# Patient Record
Sex: Male | Born: 1965 | Race: Black or African American | Hispanic: No | Marital: Married | Smoking: Never smoker
Health system: Southern US, Community
[De-identification: ages and names within clinical notes are randomized; demographics above are authoritative.]

## PROBLEM LIST (undated history)

## (undated) DIAGNOSIS — E538 Deficiency of other specified B group vitamins: Secondary | ICD-10-CM

## (undated) DIAGNOSIS — D329 Benign neoplasm of meninges, unspecified: Secondary | ICD-10-CM

## (undated) DIAGNOSIS — R7309 Other abnormal glucose: Secondary | ICD-10-CM

## (undated) DIAGNOSIS — Z8619 Personal history of other infectious and parasitic diseases: Secondary | ICD-10-CM

## (undated) DIAGNOSIS — S86012A Strain of left Achilles tendon, initial encounter: Secondary | ICD-10-CM

## (undated) DIAGNOSIS — E559 Vitamin D deficiency, unspecified: Secondary | ICD-10-CM

## (undated) HISTORY — PX: COLONOSCOPY: SHX174

## (undated) HISTORY — PX: COLONOSCOPY W/ POLYPECTOMY: SHX1380

---

## 2013-09-10 ENCOUNTER — Ambulatory Visit: Payer: Self-pay | Admitting: Gastroenterology

## 2013-11-13 ENCOUNTER — Emergency Department: Payer: Self-pay | Admitting: Emergency Medicine

## 2013-11-13 LAB — CBC
HCT: 45.9 % (ref 40.0–52.0)
HGB: 15.1 g/dL (ref 13.0–18.0)
MCH: 29.9 pg (ref 26.0–34.0)
MCHC: 32.9 g/dL (ref 32.0–36.0)
MCV: 91 fL (ref 80–100)
Platelet: 177 10*3/uL (ref 150–440)
RBC: 5.04 10*6/uL (ref 4.40–5.90)
RDW: 13.7 % (ref 11.5–14.5)
WBC: 4.3 10*3/uL (ref 3.8–10.6)

## 2013-11-13 LAB — BASIC METABOLIC PANEL
ANION GAP: 4 — AB (ref 7–16)
BUN: 12 mg/dL (ref 7–18)
CALCIUM: 8.7 mg/dL (ref 8.5–10.1)
CHLORIDE: 106 mmol/L (ref 98–107)
CREATININE: 1.14 mg/dL (ref 0.60–1.30)
Co2: 31 mmol/L (ref 21–32)
EGFR (African American): >60
EGFR (Non-African Amer.): >60
Glucose: 93 mg/dL (ref 65–99)
Osmolality: 281 (ref 275–301)
POTASSIUM: 4 mmol/L (ref 3.5–5.1)
SODIUM: 141 mmol/L (ref 136–145)

## 2013-11-13 LAB — TROPONIN I: Troponin-I: <0.02 ng/mL

## 2013-11-14 LAB — TROPONIN I: Troponin-I: <0.02 ng/mL

## 2013-11-14 LAB — CK TOTAL AND CKMB (NOT AT ARMC)
CK, Total: 259 U/L
CK-MB: 1.2 ng/mL (ref 0.5–3.6)

## 2021-05-20 ENCOUNTER — Encounter: Payer: Self-pay | Admitting: Emergency Medicine

## 2021-05-20 ENCOUNTER — Emergency Department: Payer: BLUE CROSS/BLUE SHIELD

## 2021-05-20 ENCOUNTER — Other Ambulatory Visit: Payer: Self-pay

## 2021-05-20 ENCOUNTER — Emergency Department
Admission: EM | Admit: 2021-05-20 | Discharge: 2021-05-20 | Disposition: A | Discharge status: Home / Self Care | Payer: BLUE CROSS/BLUE SHIELD | Attending: Emergency Medicine | Admitting: Emergency Medicine

## 2021-05-20 DIAGNOSIS — S161XXA Strain of muscle, fascia and tendon at neck level, initial encounter: Secondary | ICD-10-CM | POA: Insufficient documentation

## 2021-05-20 DIAGNOSIS — M7918 Myalgia, other site: Secondary | ICD-10-CM

## 2021-05-20 DIAGNOSIS — S0990XA Unspecified injury of head, initial encounter: Secondary | ICD-10-CM | POA: Diagnosis not present

## 2021-05-20 DIAGNOSIS — Y92410 Unspecified street and highway as the place of occurrence of the external cause: Secondary | ICD-10-CM | POA: Insufficient documentation

## 2021-05-20 DIAGNOSIS — M25511 Pain in right shoulder: Secondary | ICD-10-CM | POA: Insufficient documentation

## 2021-05-20 DIAGNOSIS — M545 Low back pain, unspecified: Secondary | ICD-10-CM | POA: Insufficient documentation

## 2021-05-20 DIAGNOSIS — S199XXA Unspecified injury of neck, initial encounter: Secondary | ICD-10-CM | POA: Diagnosis present

## 2021-05-20 MED ORDER — CYCLOBENZAPRINE HCL 5 MG PO TABS: 5.0000 mg | ORAL_TABLET | Freq: Three times a day (TID) | ORAL | 0 refills | Status: AC | PRN

## 2021-05-20 NOTE — Discharge Instructions (Addendum)
Your exam and CT scans are normal and reassuring. You should take OTC Tylenol and Motrin as needed. Take the muscle relaxant as needed. Apply ice and/or moist heat to any sore muscles. Follow-up with your PCP for further evaluation of your incidental meningioma finding.
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
## 2021-06-19 ENCOUNTER — Encounter (HOSPITAL_COMMUNITY): Payer: Self-pay | Admitting: Radiology

## 2021-07-24 ENCOUNTER — Other Ambulatory Visit: Payer: Self-pay | Admitting: Internal Medicine

## 2021-07-24 DIAGNOSIS — D329 Benign neoplasm of meninges, unspecified: Secondary | ICD-10-CM

## 2021-07-24 DIAGNOSIS — E559 Vitamin D deficiency, unspecified: Secondary | ICD-10-CM | POA: Diagnosis not present

## 2021-07-24 DIAGNOSIS — Z125 Encounter for screening for malignant neoplasm of prostate: Secondary | ICD-10-CM | POA: Diagnosis not present

## 2021-07-24 DIAGNOSIS — R7309 Other abnormal glucose: Secondary | ICD-10-CM | POA: Diagnosis not present

## 2021-07-24 DIAGNOSIS — E538 Deficiency of other specified B group vitamins: Secondary | ICD-10-CM | POA: Diagnosis not present

## 2021-08-09 ENCOUNTER — Ambulatory Visit: Payer: 59

## 2021-09-04 DIAGNOSIS — Z111 Encounter for screening for respiratory tuberculosis: Secondary | ICD-10-CM | POA: Diagnosis not present

## 2021-10-06 ENCOUNTER — Ambulatory Visit
Admission: RE | Admit: 2021-10-06 | Discharge: 2021-10-06 | Disposition: A | Discharge status: Home / Self Care | Payer: 59 | Source: Ambulatory Visit | Attending: Internal Medicine | Admitting: Internal Medicine

## 2021-10-06 DIAGNOSIS — D329 Benign neoplasm of meninges, unspecified: Secondary | ICD-10-CM | POA: Insufficient documentation

## 2021-10-06 DIAGNOSIS — R220 Localized swelling, mass and lump, head: Secondary | ICD-10-CM | POA: Diagnosis not present

## 2021-10-06 DIAGNOSIS — R519 Headache, unspecified: Secondary | ICD-10-CM | POA: Diagnosis not present

## 2021-10-06 MED ORDER — GADOBUTROL 1 MMOL/ML IV SOLN
8.0000 mL | Freq: Once | INTRAVENOUS | Status: AC | PRN
  Administered 2021-10-06: 8 mL via INTRAVENOUS

## 2021-10-20 DIAGNOSIS — D329 Benign neoplasm of meninges, unspecified: Secondary | ICD-10-CM | POA: Diagnosis not present

## 2021-10-26 DIAGNOSIS — R972 Elevated prostate specific antigen [PSA]: Secondary | ICD-10-CM | POA: Diagnosis not present

## 2021-11-06 DIAGNOSIS — S99912A Unspecified injury of left ankle, initial encounter: Secondary | ICD-10-CM | POA: Diagnosis not present

## 2021-11-17 DIAGNOSIS — S86012A Strain of left Achilles tendon, initial encounter: Secondary | ICD-10-CM | POA: Diagnosis not present

## 2021-11-18 ENCOUNTER — Other Ambulatory Visit: Payer: Self-pay | Admitting: Podiatry

## 2021-11-19 ENCOUNTER — Other Ambulatory Visit: Payer: Self-pay

## 2021-11-19 ENCOUNTER — Encounter
Admission: RE | Admit: 2021-11-19 | Discharge: 2021-11-19 | Disposition: A | Discharge status: Home / Self Care | Payer: 59 | Source: Ambulatory Visit | Attending: Podiatry | Admitting: Podiatry

## 2021-11-19 HISTORY — DX: Strain of left Achilles tendon, initial encounter: S86.012A

## 2021-11-19 HISTORY — DX: Personal history of other infectious and parasitic diseases: Z86.19

## 2021-11-19 HISTORY — DX: Vitamin D deficiency, unspecified: E55.9

## 2021-11-19 HISTORY — DX: Benign neoplasm of meninges, unspecified: D32.9

## 2021-11-19 HISTORY — DX: Other abnormal glucose: R73.09

## 2021-11-19 HISTORY — DX: Deficiency of other specified B group vitamins: E53.8

## 2021-11-19 MED ORDER — CEFAZOLIN SODIUM-DEXTROSE 2-4 GM/100ML-% IV SOLN
2.0000 g | INTRAVENOUS | Status: AC
  Administered 2021-11-20: 2 g via INTRAVENOUS

## 2021-11-19 MED ORDER — ORAL CARE MOUTH RINSE: 15.0000 mL | Freq: Once | OROMUCOSAL | Status: AC

## 2021-11-19 MED ORDER — FAMOTIDINE 20 MG PO TABS
20.0000 mg | ORAL_TABLET | Freq: Once | ORAL | Status: AC
Start: 2021-11-19 — End: 2021-11-20

## 2021-11-19 MED ORDER — LACTATED RINGERS IV SOLN
INTRAVENOUS | Status: DC
Start: 2021-11-19 — End: 2021-11-20

## 2021-11-19 MED ORDER — CHLORHEXIDINE GLUCONATE 0.12 % MT SOLN: 15.0000 mL | Freq: Once | OROMUCOSAL | Status: AC

## 2021-11-19 NOTE — Patient Instructions (Addendum)
Your procedure is scheduled on: 11/20/21 - Friday  Report to the Registration Desk on the 1st floor of the [facility]. To find out your arrival time, please call [phone] between 1PM - 3PM on: 11/19/21 - Thursday If your arrival time is 6:00 am, do not arrive prior to that time as the [facility] entrance doors do not open until 6:00 am.  REMEMBER: Instructions that are not followed completely may result in serious medical risk, up to and including death; or upon the discretion of your surgeon and anesthesiologist your surgery may need to be rescheduled.  Do not eat food after midnight the night before surgery.  No gum chewing, lozengers or hard candies.  You may however, drink CLEAR liquids up to 2 hours before you are scheduled to arrive for your surgery. Do not drink anything within 2 hours of your scheduled arrival time.  Clear liquids include: - water  - apple juice without pulp - gatorade (not RED colors) - black coffee or tea (Do NOT add milk or creamers to the coffee or tea) Do NOT drink anything that is not on this list.  TAKE THESE MEDICATIONS THE MORNING OF SURGERY WITH A SIP OF WATER: NONE  One week prior to surgery: Stop Anti-inflammatories (NSAIDS) such as Advil, Aleve, Ibuprofen, Motrin, Naproxen, Naprosyn and Aspirin based products such as Excedrin, Goodys Powder, BC Powder.  Stop ANY OVER THE COUNTER supplements until after surgery.  You may take Tylenol if needed for pain up until the day of surgery.  No Alcohol for 24 hours before or after surgery.  No Smoking including e-cigarettes for 24 hours prior to surgery.  No chewable tobacco products for at least 6 hours prior to surgery.  No nicotine patches on the day of surgery.  Do not use any "recreational" drugs for at least a week prior to your surgery.  Please be advised that the combination of cocaine and anesthesia may have negative outcomes, up to and including death. If you test positive for  cocaine, your surgery will be cancelled.  On the morning of surgery brush your teeth with toothpaste and water, you may rinse your mouth with mouthwash if you wish. Do not swallow any toothpaste or mouthwash.  Do not wear jewelry, make-up, hairpins, clips or nail polish.  Do not wear lotions, powders, or perfumes.   Do not shave body from the neck down 48 hours prior to surgery just in case you cut yourself which could leave a site for infection.  Also, freshly shaved skin may become irritated if using the CHG soap.  Contact lenses, hearing aids and dentures may not be worn into surgery.  Do not bring valuables to the hospital. [facility] is not responsible for any missing/lost belongings or valuables.   Notify your doctor if there is any change in your medical condition (cold, fever, infection).  Wear comfortable clothing (specific to your surgery type) to the hospital.  After surgery, you can help prevent lung complications by doing breathing exercises.  Take deep breaths and cough every 1-2 hours. Your doctor may order a device called an Incentive Spirometer to help you take deep breaths. When coughing or sneezing, hold a pillow firmly against your incision with both hands. This is called "splinting." Doing this helps protect your incision. It also decreases belly discomfort.  If you are being admitted to the hospital overnight, leave your suitcase in the car. After surgery it may be brought to your room.  If you are  being discharged the day of surgery, you will not be allowed to drive home. You will need a responsible adult (18 years or older) to drive you home and stay with you that night.   If you are taking public transportation, you will need to have a responsible adult (18 years or older) with you. Please confirm with your physician that it is acceptable to use public transportation.   Please call the [facility] Dept. at [phone] if you have any questions  about these instructions.  Surgery Visitation Policy:  Patients undergoing a surgery or procedure may have two family members or support persons with them as long as the person is not COVID-19 positive or experiencing its symptoms.   Inpatient Visitation:    Visiting hours are 7 a.m. to 8 p.m. Up to four visitors are allowed at one time in a patient room, including children. The visitors may rotate out with other people during the day. One designated support person (adult) may remain overnight.

## 2021-11-20 ENCOUNTER — Other Ambulatory Visit: Payer: Self-pay

## 2021-11-20 ENCOUNTER — Ambulatory Visit: Payer: 59 | Admitting: Certified Registered Nurse Anesthetist

## 2021-11-20 ENCOUNTER — Encounter: Payer: Self-pay | Admitting: Podiatry

## 2021-11-20 ENCOUNTER — Encounter
Admission: RE | Disposition: A | Discharge status: Home / Self Care | Payer: Self-pay | Source: Home / Self Care | Attending: Podiatry

## 2021-11-20 ENCOUNTER — Ambulatory Visit
Admission: RE | Admit: 2021-11-20 | Discharge: 2021-11-20 | Disposition: A | Discharge status: Home / Self Care | Payer: 59 | Attending: Podiatry | Admitting: Podiatry

## 2021-11-20 DIAGNOSIS — S86012A Strain of left Achilles tendon, initial encounter: Secondary | ICD-10-CM | POA: Diagnosis not present

## 2021-11-20 DIAGNOSIS — X58XXXA Exposure to other specified factors, initial encounter: Secondary | ICD-10-CM | POA: Diagnosis not present

## 2021-11-20 HISTORY — PX: ACHILLES TENDON SURGERY: SHX542

## 2021-11-20 SURGERY — REPAIR, TENDON, ACHILLES
Anesthesia: General | Laterality: Left

## 2021-11-20 MED ORDER — BUPIVACAINE HCL (PF) 0.25 % IJ SOLN
INTRAMUSCULAR | Status: AC
  Filled 2021-11-20: qty 30

## 2021-11-20 MED ORDER — FENTANYL CITRATE (PF) 100 MCG/2ML IJ SOLN
INTRAMUSCULAR | Status: AC
Start: 2021-11-20 — End: ?
  Filled 2021-11-20: qty 2

## 2021-11-20 MED ORDER — FAMOTIDINE 20 MG PO TABS
ORAL_TABLET | ORAL | Status: AC
  Administered 2021-11-20: 20 mg via ORAL
  Filled 2021-11-20: qty 1

## 2021-11-20 MED ORDER — MIDAZOLAM HCL 2 MG/2ML IJ SOLN
INTRAMUSCULAR | Status: AC
  Filled 2021-11-20: qty 2

## 2021-11-20 MED ORDER — DEXAMETHASONE SODIUM PHOSPHATE 10 MG/ML IJ SOLN
INTRAMUSCULAR | Status: DC | PRN
  Administered 2021-11-20: 10 mg via INTRAVENOUS

## 2021-11-20 MED ORDER — PROPOFOL 10 MG/ML IV BOLUS
INTRAVENOUS | Status: DC | PRN
  Administered 2021-11-20: 60 mg via INTRAVENOUS
  Administered 2021-11-20: 40 mg via INTRAVENOUS
  Administered 2021-11-20: 50 mg via INTRAVENOUS

## 2021-11-20 MED ORDER — ROCURONIUM BROMIDE 100 MG/10ML IV SOLN
INTRAVENOUS | Status: DC | PRN
Start: 2021-11-20 — End: 2021-11-20

## 2021-11-20 MED ORDER — ROCURONIUM BROMIDE 100 MG/10ML IV SOLN
INTRAVENOUS | Status: DC | PRN
  Administered 2021-11-20: 50 mg via INTRAVENOUS

## 2021-11-20 MED ORDER — PROMETHAZINE HCL 25 MG/ML IJ SOLN: 6.2500 mg | INTRAMUSCULAR | Status: DC | PRN

## 2021-11-20 MED ORDER — ACETAMINOPHEN 10 MG/ML IV SOLN
INTRAVENOUS | Status: DC | PRN
  Administered 2021-11-20: 1000 mg via INTRAVENOUS

## 2021-11-20 MED ORDER — BUPIVACAINE-EPINEPHRINE (PF) 0.25% -1:200000 IJ SOLN
INTRAMUSCULAR | Status: AC
  Filled 2021-11-20: qty 30

## 2021-11-20 MED ORDER — FENTANYL CITRATE (PF) 100 MCG/2ML IJ SOLN: 25.0000 ug | INTRAMUSCULAR | Status: DC | PRN

## 2021-11-20 MED ORDER — LIDOCAINE HCL (CARDIAC) PF 100 MG/5ML IV SOSY
PREFILLED_SYRINGE | INTRAVENOUS | Status: DC | PRN
  Administered 2021-11-20: 100 mg via INTRAVENOUS

## 2021-11-20 MED ORDER — EPHEDRINE SULFATE (PRESSORS) 50 MG/ML IJ SOLN
INTRAMUSCULAR | Status: DC | PRN
  Administered 2021-11-20: 5 mg via INTRAVENOUS

## 2021-11-20 MED ORDER — BUPIVACAINE HCL (PF) 0.5 % IJ SOLN
INTRAMUSCULAR | Status: AC
Start: 2021-11-20 — End: ?
  Filled 2021-11-20: qty 30

## 2021-11-20 MED ORDER — PROPOFOL 10 MG/ML IV BOLUS
INTRAVENOUS | Status: AC
  Filled 2021-11-20: qty 20

## 2021-11-20 MED ORDER — LIDOCAINE HCL (PF) 1 % IJ SOLN
INTRAMUSCULAR | Status: AC
  Filled 2021-11-20: qty 30

## 2021-11-20 MED ORDER — METOCLOPRAMIDE HCL 5 MG/ML IJ SOLN: 5.0000 mg | Freq: Three times a day (TID) | INTRAMUSCULAR | Status: DC | PRN

## 2021-11-20 MED ORDER — MIDAZOLAM HCL 2 MG/2ML IJ SOLN
INTRAMUSCULAR | Status: DC | PRN
  Administered 2021-11-20: 2 mg via INTRAVENOUS

## 2021-11-20 MED ORDER — ONDANSETRON HCL 4 MG PO TABS: 4.0000 mg | ORAL_TABLET | Freq: Four times a day (QID) | ORAL | Status: DC | PRN

## 2021-11-20 MED ORDER — PROPOFOL 1000 MG/100ML IV EMUL
INTRAVENOUS | Status: AC
  Filled 2021-11-20: qty 100

## 2021-11-20 MED ORDER — ACETAMINOPHEN 10 MG/ML IV SOLN
INTRAVENOUS | Status: AC
  Filled 2021-11-20: qty 100

## 2021-11-20 MED ORDER — CHLORHEXIDINE GLUCONATE 0.12 % MT SOLN
OROMUCOSAL | Status: AC
  Administered 2021-11-20: 15 mL via OROMUCOSAL
  Filled 2021-11-20: qty 15

## 2021-11-20 MED ORDER — ONDANSETRON HCL 4 MG/2ML IJ SOLN: 4.0000 mg | Freq: Four times a day (QID) | INTRAMUSCULAR | Status: DC | PRN

## 2021-11-20 MED ORDER — BUPIVACAINE-EPINEPHRINE (PF) 0.25% -1:200000 IJ SOLN
INTRAMUSCULAR | Status: DC | PRN
  Administered 2021-11-20: 20 mL

## 2021-11-20 MED ORDER — ONDANSETRON HCL 4 MG/2ML IJ SOLN
INTRAMUSCULAR | Status: DC | PRN
  Administered 2021-11-20: 4 mg via INTRAVENOUS

## 2021-11-20 MED ORDER — OXYCODONE-ACETAMINOPHEN 5-325 MG PO TABS: 1.0000 | ORAL_TABLET | Freq: Four times a day (QID) | ORAL | 0 refills | Status: DC | PRN

## 2021-11-20 MED ORDER — CEFAZOLIN SODIUM-DEXTROSE 2-4 GM/100ML-% IV SOLN
INTRAVENOUS | Status: AC
  Filled 2021-11-20: qty 100

## 2021-11-20 MED ORDER — FENTANYL CITRATE (PF) 100 MCG/2ML IJ SOLN
INTRAMUSCULAR | Status: DC | PRN
Start: 2021-11-20 — End: 2021-11-20
  Administered 2021-11-20: 25 ug via INTRAVENOUS
  Administered 2021-11-20: 50 ug via INTRAVENOUS
  Administered 2021-11-20: 25 ug via INTRAVENOUS

## 2021-11-20 MED ORDER — 0.9 % SODIUM CHLORIDE (POUR BTL) OPTIME
TOPICAL | Status: DC | PRN
Start: 2021-11-20 — End: 2021-11-20
  Administered 2021-11-20: 500 mL

## 2021-11-20 MED ORDER — BUPIVACAINE LIPOSOME 1.3 % IJ SUSP
INTRAMUSCULAR | Status: AC
  Filled 2021-11-20: qty 20

## 2021-11-20 MED ORDER — GLYCOPYRROLATE 0.2 MG/ML IJ SOLN
INTRAMUSCULAR | Status: DC | PRN
  Administered 2021-11-20: .2 mg via INTRAVENOUS

## 2021-11-20 MED ORDER — SUGAMMADEX SODIUM 200 MG/2ML IV SOLN
INTRAVENOUS | Status: DC | PRN
  Administered 2021-11-20: 200 mg via INTRAVENOUS

## 2021-11-20 MED ORDER — METOCLOPRAMIDE HCL 10 MG PO TABS: 5.0000 mg | ORAL_TABLET | Freq: Three times a day (TID) | ORAL | Status: DC | PRN

## 2021-11-20 SURGICAL SUPPLY — 57 items
BLADE SURG 15 STRL LF DISP TIS (BLADE) ×1 IMPLANT
BLADE SURG 15 STRL SS (BLADE) ×2
BLADE SURG MINI STRL (BLADE) ×2 IMPLANT
BNDG CMPR STD VLCR NS LF 5.8X4 (GAUZE/BANDAGES/DRESSINGS) ×2
BNDG CONFORM 2 STRL LF (GAUZE/BANDAGES/DRESSINGS) ×2 IMPLANT
BNDG CONFORM 3 STRL LF (GAUZE/BANDAGES/DRESSINGS) ×2 IMPLANT
BNDG ELASTIC 4X5.8 VLCR NS LF (GAUZE/BANDAGES/DRESSINGS) ×4 IMPLANT
BNDG ESMARK 4X12 TAN STRL LF (GAUZE/BANDAGES/DRESSINGS) ×2 IMPLANT
BNDG ESMARK 6X12 TAN STRL LF (GAUZE/BANDAGES/DRESSINGS) ×2 IMPLANT
BNDG GAUZE ELAST 4 BULKY (GAUZE/BANDAGES/DRESSINGS) ×2 IMPLANT
DRAPE FLUOR MINI C-ARM 54X84 (DRAPES) ×2 IMPLANT
DURAPREP 26ML APPLICATOR (WOUND CARE) ×2 IMPLANT
ELECT REM PT RETURN 9FT ADLT (ELECTROSURGICAL) ×2
ELECTRODE REM PT RTRN 9FT ADLT (ELECTROSURGICAL) ×1 IMPLANT
GAUZE SPONGE 4X4 12PLY STRL (GAUZE/BANDAGES/DRESSINGS) ×2 IMPLANT
GAUZE XEROFORM 1X8 LF (GAUZE/BANDAGES/DRESSINGS) ×2 IMPLANT
GLOVE BIO SURGEON STRL SZ7.5 (GLOVE) ×2 IMPLANT
GLOVE SURG UNDER LTX SZ8 (GLOVE) ×2 IMPLANT
GOWN STRL REUS W/ TWL XL LVL3 (GOWN DISPOSABLE) ×2 IMPLANT
GOWN STRL REUS W/TWL XL LVL3 (GOWN DISPOSABLE) ×4
HANDLE YANKAUER SUCT BULB TIP (MISCELLANEOUS) ×2 IMPLANT
KIT TURNOVER KIT A (KITS) ×2 IMPLANT
MANIFOLD NEPTUNE II (INSTRUMENTS) ×2 IMPLANT
NDL FILTER BLUNT 18X1 1/2 (NEEDLE) ×1 IMPLANT
NDL HYPO 25X1 1.5 SAFETY (NEEDLE) ×3 IMPLANT
NDL MAYO CATGUT SZ5 (NEEDLE) ×2
NDL SUT 5 .5 CRC TPR PNT MAYO (NEEDLE) ×1 IMPLANT
NEEDLE FILTER BLUNT 18X 1/2SAF (NEEDLE) ×1
NEEDLE FILTER BLUNT 18X1 1/2 (NEEDLE) ×1 IMPLANT
NEEDLE HYPO 25X1 1.5 SAFETY (NEEDLE) ×6 IMPLANT
NS IRRIG 500ML POUR BTL (IV SOLUTION) ×2 IMPLANT
PACK EXTREMITY ARMC (MISCELLANEOUS) ×2 IMPLANT
SPLINT CAST 1 STEP 5X30 WHT (MISCELLANEOUS) ×2 IMPLANT
SPLINT FAST PLASTER 5X30 (CAST SUPPLIES) ×1
SPLINT PLASTER CAST FAST 5X30 (CAST SUPPLIES) ×1 IMPLANT
SPONGE T-LAP 18X18 ~~LOC~~+RFID (SPONGE) ×2 IMPLANT
STOCKINETTE M/LG 89821 (MISCELLANEOUS) ×2 IMPLANT
STRIP CLOSURE SKIN 1/2X4 (GAUZE/BANDAGES/DRESSINGS) ×2 IMPLANT
SUT ETHIBOND 0 36 GRN (SUTURE) ×2 IMPLANT
SUT MNCRL 4-0 (SUTURE) ×2
SUT MNCRL 4-0 27XMFL (SUTURE) ×1
SUT PDS AB 0 CT1 27 (SUTURE) ×1 IMPLANT
SUT TICRON 2-0 30IN 311381 (SUTURE) ×2 IMPLANT
SUT ULTRABRAID #2 38 (SUTURE) ×2 IMPLANT
SUT VIC AB 0 SH 27 (SUTURE) ×2 IMPLANT
SUT VIC AB 2-0 SH 27 (SUTURE) ×6
SUT VIC AB 2-0 SH 27XBRD (SUTURE) ×2 IMPLANT
SUT VIC AB 3-0 SH 27 (SUTURE) ×2
SUT VIC AB 3-0 SH 27X BRD (SUTURE) ×1 IMPLANT
SUT VIC AB 4-0 FS2 27 (SUTURE) ×2 IMPLANT
SUT VICRYL AB 3-0 FS1 BRD 27IN (SUTURE) ×2 IMPLANT
SUTURE MNCRL 4-0 27XMF (SUTURE) ×1 IMPLANT
SWABSTK COMLB BENZOIN TINCTURE (MISCELLANEOUS) ×2 IMPLANT
SYR 10ML LL (SYRINGE) ×4 IMPLANT
SYR 3ML LL SCALE MARK (SYRINGE) ×2 IMPLANT
WAND TOPAZ MICRO DEBRIDER (MISCELLANEOUS) IMPLANT
WATER STERILE IRR 500ML POUR (IV SOLUTION) ×2 IMPLANT

## 2021-11-20 NOTE — Anesthesia Procedure Notes (Signed)
Procedure Name: Intubation Date/Time: 11/20/2021 1:31 PM Performed by: Gayland Curry, CRNA Pre-anesthesia Checklist: Patient identified, Emergency Drugs available, Suction available and Patient being monitored Patient Re-evaluated:Patient Re-evaluated prior to induction Oxygen Delivery Method: Circle system utilized Preoxygenation: Pre-oxygenation with 100% oxygen Induction Type: IV induction Ventilation: Mask ventilation without difficulty Laryngoscope Size: Mac and 4 Grade View: Grade II Tube type: Oral Tube size: 7.0 mm Number of attempts: 1 Placement Confirmation: ETT inserted through vocal cords under direct vision, positive ETCO2 and breath sounds checked- equal and bilateral Secured at: 22 cm Tube secured with: Tape Dental Injury: Teeth and Oropharynx as per pre-operative assessment

## 2021-11-20 NOTE — Op Note (Signed)
Operative note   Surgeon:Jonathan Brewer: None    Preop diagnosis: Achilles tendon rupture left lower leg    Postop diagnosis: Same    Procedure: 1.Open repair Achilles tendon rupture left lower leg 2.  Placement of a posterior splint left lower leg   EBL: Minimal    Anesthesia:local and general.  Local consisted of a total of 15 cc of Exparel long-acting anesthetic and 15 cc of 0.25% bupivacaine with epinephrine    Hemostasis: Bupivacaine with epinephrine    Specimen: None    Complications: None    Operative indications:Jonathan Brewer is an 56 y.o. that presents today for surgical intervention.  The risks/benefits/alternatives/complications have been discussed and consent has been given.    Procedure:  Patient was brought into the OR and placed on the operating table in theprone position. After anesthesia was obtained theleft lower extremity was prepped and draped in usual sterile fashion.  Attention was directed to the posterior aspect of the left lower leg at the level of the watershed band of the Achilles tendon and approximate 10 cm incision was performed just medial to midline.  Sharp and blunt dissection carried down to the peritenon.  Next a midline peritenon incision was performed.  This exposed the Achilles tendon.  There was noted to be a full-thickness rupture at the watershed band with fraying of the ends.  This was then flushed with copious amounts of irrigation.  At this time with a #2 Tycron suture a Krakw suture type was used proximal and distal.  The foot was held in a plantarflexed position and both ends of the sutures were tied together with good reanastomosis of the Achilles.  A supplemental proximal and distal Krakw type suture with an 0 Ethibond was then used along the more central portion of the Achilles with good stability.  Further supplementation at the rupture site was performed with an 0 PDS.  Good stability with good excursion of the tendon was  noted after reanastomosis.  The foot was held in a gravity plantarflexed position similar to the contralateral foot.  At this time the wound was flushed once again.  Closure of the peritenon was performed with a 2-0 Vicryl.  The subcutaneous tissue was closed with a 3-0 Vicryl and the skin closed with a 4-0 Monocryl.  A bulky padded dressing was applied.  The patient was held in gravity equinus and placed into a posterior splint by myself.    Patient tolerated the procedure and anesthesia well.  Was transported from the OR to the PACU with all vital signs stable and vascular status intact. To be discharged per routine protocol.  Will follow up in approximately 1 week in the outpatient clinic.

## 2021-11-20 NOTE — Discharge Instructions (Addendum)
[facility]  POST OPERATIVE INSTRUCTIONS FOR DR. Vickki Muff AND DR. [place]   Take your medication as prescribed.  Pain medication should be taken only as needed.  Keep the dressing clean, dry and intact.  Keep your foot elevated above the heart level for the first 48 hours.  Walking to the bathroom and brief periods of walking are acceptable, unless we have instructed you to be non-weight bearing.  Always wear your post-op shoe when walking.  Always use your crutches if you are to be non-weight bearing.  Do not take a shower. Baths are permissible as long as the foot is kept out of the water.   Every hour you are awake:  Bend your knee 15 times.   Call [facility] [phone]) if any of the following problems occur: You develop a temperature or fever. The bandage becomes saturated with blood. Medication does not stop your pain. Injury of the foot occurs. Any symptoms of infection including redness, odor, or red streaks running from wound.     AMBULATORY SURGERY  DISCHARGE INSTRUCTIONS   The drugs that you were given will stay in your system until tomorrow so for the next 24 hours you should not:  Drive an automobile Make any legal decisions Drink any alcoholic beverage   You may resume regular meals tomorrow.  Today it is better to start with liquids and gradually work up to solid foods.  You may eat anything you prefer, but it is better to start with liquids, then soup and crackers, and gradually work up to solid foods.   Please notify your doctor immediately if you have any unusual bleeding, trouble breathing, redness and pain at the surgery site, drainage, fever, or pain not relieved by medication.    Additional Instructions:        Please contact your physician with any problems or Same Day Surgery at [phone], Monday through Friday 6 am to 4 pm, or [place] at  [facility] number at [phone].

## 2021-11-20 NOTE — Anesthesia Postprocedure Evaluation (Signed)
Anesthesia Post Note  Patient: Jonathan Brewer  Procedure(s) Performed: ACHILLES TENDON REPAIR (Left)  Patient location during evaluation: PACU Anesthesia Type: General Level of consciousness: awake and alert Pain management: pain level controlled Vital Signs Assessment: post-procedure vital signs reviewed and stable Respiratory status: spontaneous breathing, nonlabored ventilation, respiratory function stable and patient connected to nasal cannula oxygen Cardiovascular status: blood pressure returned to baseline and stable Postop Assessment: no apparent nausea or vomiting Anesthetic complications: no   No notable events documented.   Last Vitals:  Vitals:   11/20/21 1613 11/20/21 1649  BP: (!) 147/94 131/83  Pulse: 61 (!) 56  Resp: 18   Temp: (!) 36.1 C   SpO2: 100%     Last Pain:  Vitals:   11/20/21 1649  TempSrc:   PainSc: 0-No pain                 Martha Clan

## 2021-11-20 NOTE — Transfer of Care (Signed)
Immediate Anesthesia Transfer of Care Note  Patient: Jonathan Brewer  Procedure(s) Performed: ACHILLES TENDON REPAIR (Left)  Patient Location: PACU  Anesthesia Type:General  Level of Consciousness: sedated  Airway & Oxygen Therapy: Patient Spontanous Breathing and Patient connected to face mask oxygen  Post-op Assessment: Report given to RN and Post -op Vital signs reviewed and stable  Post vital signs: Reviewed  Last Vitals:  Vitals Value Taken Time  BP    Temp    Pulse 83 11/20/21 1516  Resp 17 11/20/21 1516  SpO2 99 % 11/20/21 1516  Vitals shown include unvalidated device data.  Last Pain:  Vitals:   11/20/21 1219  TempSrc: Temporal  PainSc: 0-No pain         Complications: No notable events documented.

## 2021-11-20 NOTE — H&P (Signed)
HISTORY AND PHYSICAL INTERVAL NOTE:  11/20/2021  12:15 PM  Jonathan Brewer  has presented today for surgery, with the diagnosis of Ruptured left achilles.  The various methods of treatment have been discussed with the patient.  No guarantees were given.  After consideration of risks, benefits and other options for treatment, the patient has consented to surgery.  I have reviewed the patients' chart and labs.     A history and physical examination was performed in my office.  The patient was reexamined.  There have been no changes to this history and physical examination.  [name] A

## 2021-11-20 NOTE — Anesthesia Preprocedure Evaluation (Signed)
Anesthesia Evaluation  Patient identified by MRN, date of birth, ID band Patient awake    Reviewed: Allergy & Precautions, H&P , NPO status , Patient's Chart, lab work & pertinent test results, reviewed documented beta blocker date and time   History of Anesthesia Complications Negative for: history of anesthetic complications  Airway Mallampati: I  TM Distance: >3 FB Neck ROM: full    Dental  (+) Dental Advidsory Given, Caps, Chipped   Pulmonary neg pulmonary ROS,    Pulmonary exam normal breath sounds clear to auscultation       Cardiovascular Exercise Tolerance: Good negative cardio ROS Normal cardiovascular exam Rhythm:regular Rate:Normal     Neuro/Psych negative neurological ROS  negative psych ROS   GI/Hepatic negative GI ROS, Neg liver ROS,   Endo/Other  negative endocrine ROS  Renal/GU negative Renal ROS  negative genitourinary   Musculoskeletal   Abdominal   Peds  Hematology negative hematology ROS (+)   Anesthesia Other Findings Past Medical History: No date: Abnormal glucose No date: Achilles rupture, left No date: B12 deficiency No date: History of herpes simplex infection No date: Meningioma (HCC) No date: Vitamin D deficiency   Reproductive/Obstetrics negative OB ROS                            Anesthesia Physical Anesthesia Plan  ASA: 1  Anesthesia Plan: General   Post-op Pain Management:    Induction: Intravenous  PONV Risk Score and Plan: 2 and Ondansetron, Dexamethasone, Midazolam and Treatment may vary due to age or medical condition  Airway Management Planned: LMA  Additional Equipment:   Intra-op Plan:   Post-operative Plan: Extubation in OR  Informed Consent: I have reviewed the patients History and Physical, chart, labs and discussed the procedure including the risks, benefits and alternatives for the proposed anesthesia with the patient or  authorized representative who has indicated his/her understanding and acceptance.     Dental Advisory Given  Plan Discussed with: Anesthesiologist, CRNA and Surgeon  Anesthesia Plan Comments:        Anesthesia Quick Evaluation

## 2021-11-21 ENCOUNTER — Encounter: Payer: Self-pay | Admitting: Podiatry

## 2021-11-24 ENCOUNTER — Emergency Department: Payer: 59

## 2021-11-24 ENCOUNTER — Other Ambulatory Visit: Payer: Self-pay

## 2021-11-24 ENCOUNTER — Encounter: Payer: Self-pay | Admitting: *Deleted

## 2021-11-24 DIAGNOSIS — I951 Orthostatic hypotension: Secondary | ICD-10-CM | POA: Diagnosis not present

## 2021-11-24 DIAGNOSIS — R0789 Other chest pain: Secondary | ICD-10-CM | POA: Diagnosis not present

## 2021-11-24 DIAGNOSIS — Z0389 Encounter for observation for other suspected diseases and conditions ruled out: Secondary | ICD-10-CM | POA: Diagnosis not present

## 2021-11-24 DIAGNOSIS — Z79899 Other long term (current) drug therapy: Secondary | ICD-10-CM | POA: Diagnosis not present

## 2021-11-24 DIAGNOSIS — I2699 Other pulmonary embolism without acute cor pulmonale: Secondary | ICD-10-CM | POA: Diagnosis not present

## 2021-11-24 DIAGNOSIS — R079 Chest pain, unspecified: Secondary | ICD-10-CM | POA: Diagnosis not present

## 2021-11-24 DIAGNOSIS — I2693 Single subsegmental pulmonary embolism without acute cor pulmonale: Secondary | ICD-10-CM | POA: Diagnosis not present

## 2021-11-24 LAB — BASIC METABOLIC PANEL
Anion gap: 8 (ref 5–15)
BUN: 14 mg/dL (ref 6–20)
CO2: 28 mmol/L (ref 22–32)
Calcium: 9.2 mg/dL (ref 8.9–10.3)
Chloride: 102 mmol/L (ref 98–111)
Creatinine, Ser: 0.97 mg/dL (ref 0.61–1.24)
GFR, Estimated: >60 mL/min (ref >60)
Glucose, Bld: 118 mg/dL — ABNORMAL HIGH (ref 70–99)
Potassium: 4 mmol/L (ref 3.5–5.1)
Sodium: 138 mmol/L (ref 135–145)

## 2021-11-24 LAB — CBC
HCT: 45.3 % (ref 39.0–52.0)
Hemoglobin: 15.1 g/dL (ref 13.0–17.0)
MCH: 29.3 pg (ref 26.0–34.0)
MCHC: 33.3 g/dL (ref 30.0–36.0)
MCV: 88 fL (ref 80.0–100.0)
Platelets: 208 10*3/uL (ref 150–400)
RBC: 5.15 MIL/uL (ref 4.22–5.81)
RDW: 14.2 % (ref 11.5–15.5)
WBC: 6.8 10*3/uL (ref 4.0–10.5)
nRBC: 0 % (ref 0.0–0.2)

## 2021-11-24 LAB — TROPONIN I (HIGH SENSITIVITY): Troponin I (High Sensitivity): 6 ng/L (ref <18)

## 2021-11-24 LAB — D-DIMER, QUANTITATIVE: D-Dimer, Quant: 1.66 ug/mL-FEU — ABNORMAL HIGH (ref 0.00–0.50)

## 2021-11-24 NOTE — ED Provider Triage Note (Signed)
Emergency Medicine Provider Triage Evaluation Note  Jonathan Brewer , a 56 y.o. male  was evaluated in triage.  Pt complains of left side chest pain x 1 day. No shortness of breath. Achilles tendon repair last week. Takes '81mg'$  ASA daily otherwise no blood thinner.  Review of Systems  Positive: Chest pain Negative: Shortness of breath.  Physical Exam  BP 123/90 (BP Location: Left Arm)   Pulse (!) 101   Temp 98.8 F (37.1 C) (Oral)   Resp 20   Ht '6\' 1"'$  (1.854 m)   Wt 83.9 kg   SpO2 98%   BMI 24.41 kg/m  Gen:   Awake, no distress   Resp:  Normal effort  MSK:   Moves extremities without difficulty  Other:    Medical Decision Making  Medically screening exam initiated at 10:05 PM.  Appropriate orders placed.  Jonathan Brewer was informed that the remainder of the evaluation will be completed by another provider, this initial triage assessment does not replace that evaluation, and the importance of remaining in the ED until their evaluation is complete   Victorino Dike, [facility] 11/24/21 2208

## 2021-11-24 NOTE — ED Triage Notes (Signed)
Pt has left chest pain for 1 day.  No n/v/   no sob.  Nonradiaiting pain.  Pt alert  speech clear.  Pt has splint on left foot, surgery last week on achilles tendon.  Pt alert  speech clear.

## 2021-11-25 ENCOUNTER — Emergency Department: Payer: 59

## 2021-11-25 ENCOUNTER — Other Ambulatory Visit: Payer: Self-pay

## 2021-11-25 ENCOUNTER — Observation Stay: Payer: 59

## 2021-11-25 ENCOUNTER — Other Ambulatory Visit (HOSPITAL_COMMUNITY): Payer: Self-pay

## 2021-11-25 ENCOUNTER — Observation Stay
Admission: EM | Admit: 2021-11-25 | Discharge: 2021-11-25 | Disposition: A | Discharge status: Home / Self Care | Payer: 59 | Attending: Internal Medicine | Admitting: Internal Medicine

## 2021-11-25 DIAGNOSIS — I2699 Other pulmonary embolism without acute cor pulmonale: Secondary | ICD-10-CM | POA: Diagnosis not present

## 2021-11-25 DIAGNOSIS — D329 Benign neoplasm of meninges, unspecified: Secondary | ICD-10-CM | POA: Diagnosis present

## 2021-11-25 DIAGNOSIS — Z9889 Other specified postprocedural states: Secondary | ICD-10-CM

## 2021-11-25 DIAGNOSIS — I951 Orthostatic hypotension: Secondary | ICD-10-CM | POA: Diagnosis not present

## 2021-11-25 DIAGNOSIS — I2693 Single subsegmental pulmonary embolism without acute cor pulmonale: Secondary | ICD-10-CM | POA: Diagnosis not present

## 2021-11-25 LAB — APTT: aPTT: 27 seconds (ref 24–36)

## 2021-11-25 LAB — HEPARIN LEVEL (UNFRACTIONATED): Heparin Unfractionated: 1 IU/mL — ABNORMAL HIGH (ref 0.30–0.70)

## 2021-11-25 LAB — PROTIME-INR
INR: 1 (ref 0.8–1.2)
Prothrombin Time: 12.9 seconds (ref 11.4–15.2)

## 2021-11-25 LAB — TROPONIN I (HIGH SENSITIVITY): Troponin I (High Sensitivity): 5 ng/L (ref <18)

## 2021-11-25 MED ORDER — HEPARIN (PORCINE) 25000 UT/250ML-% IV SOLN
1400.0000 [IU]/h | INTRAVENOUS | Status: DC
  Administered 2021-11-25: 1400 [IU]/h via INTRAVENOUS
  Filled 2021-11-25: qty 250

## 2021-11-25 MED ORDER — HEPARIN BOLUS VIA INFUSION
5000.0000 [IU] | Freq: Once | INTRAVENOUS | Status: AC
  Administered 2021-11-25: 5000 [IU] via INTRAVENOUS
  Filled 2021-11-25: qty 5000

## 2021-11-25 MED ORDER — SODIUM CHLORIDE 0.9 % IV SOLN: INTRAVENOUS | Status: DC

## 2021-11-25 MED ORDER — ACETAMINOPHEN 325 MG PO TABS
650.0000 mg | ORAL_TABLET | Freq: Four times a day (QID) | ORAL | Status: DC | PRN
  Administered 2021-11-25: 650 mg via ORAL
  Filled 2021-11-25: qty 2

## 2021-11-25 MED ORDER — APIXABAN 5 MG PO TABS: 5.0000 mg | ORAL_TABLET | Freq: Two times a day (BID) | ORAL | Status: DC

## 2021-11-25 MED ORDER — ONDANSETRON HCL 4 MG/2ML IJ SOLN
4.0000 mg | INTRAMUSCULAR | Status: AC
  Administered 2021-11-25: 4 mg via INTRAVENOUS
  Filled 2021-11-25: qty 2

## 2021-11-25 MED ORDER — IOHEXOL 350 MG/ML SOLN
75.0000 mL | Freq: Once | INTRAVENOUS | Status: AC | PRN
  Administered 2021-11-25: 75 mL via INTRAVENOUS

## 2021-11-25 MED ORDER — OXYCODONE HCL 5 MG PO TABS
5.0000 mg | ORAL_TABLET | ORAL | Status: DC | PRN
  Administered 2021-11-25: 5 mg via ORAL
  Filled 2021-11-25: qty 1

## 2021-11-25 MED ORDER — APIXABAN 5 MG PO TABS
10.0000 mg | ORAL_TABLET | Freq: Two times a day (BID) | ORAL | Status: DC
  Administered 2021-11-25: 10 mg via ORAL
  Filled 2021-11-25: qty 2

## 2021-11-25 MED ORDER — APIXABAN (ELIQUIS) VTE STARTER PACK (10MG AND 5MG): ORAL_TABLET | ORAL | 0 refills | Status: AC

## 2021-11-25 MED ORDER — TRAMADOL HCL 50 MG PO TABS
50.0000 mg | ORAL_TABLET | Freq: Four times a day (QID) | ORAL | Status: DC | PRN
  Administered 2021-11-25: 50 mg via ORAL
  Filled 2021-11-25: qty 1

## 2021-11-25 MED ORDER — TRAMADOL HCL 50 MG PO TABS: 50.0000 mg | ORAL_TABLET | Freq: Four times a day (QID) | ORAL | 0 refills | Status: AC | PRN

## 2021-11-25 NOTE — Hospital Course (Signed)
56 year old male with past medical history of meningioma and left Achilles tendon rupture in mid May status post surgical repair by podiatry on 5/26 who presented to the emergency room on the night of 5/30 with complaints of left-sided chest pain.  Work-up revealed multiple small pulmonary emboli.

## 2021-11-25 NOTE — ED Provider Notes (Signed)
[facility] Provider Note    Event Date/Time   First MD Initiated Contact with Patient 11/25/21 0149     (approximate)   History   Chief Complaint Chest Pain   HPI  Jonathan Brewer is a 56 y.o. male with past medical history of meningioma and Achilles tendon rupture who presents to the ED complaining of chest pain.  Patient reports that he has had about 24 hours of intermittent pain in the left side of his chest.  He describes it as sharp and exacerbated when he goes to take a deep breath or talk.  He did not exhibit associated fevers, cough, or shortness of breath.  He has not noticed any pain or swelling in his legs, but reports he recently had surgery for an Achilles tendon rupture.  He has been taking a daily baby aspirin since then, denies any history of DVT/PE.     Physical Exam   Triage Vital Signs: ED Triage Vitals [11/24/21 2203]  Enc Vitals Group     BP 123/90     Pulse Rate (!) 101     Resp 20     Temp 98.8 F (37.1 C)     Temp Source Oral     SpO2 98 %     Weight 185 lb (83.9 kg)     Height '6\' 1"'$  (1.854 m)     Head Circumference      Peak Flow      Pain Score 4     Pain Loc      Pain Edu?      Excl. in [place]?     Most recent vital signs: Vitals:   11/25/21 0025 11/25/21 0155  BP: 125/77 (!) 132/92  Pulse: 78 80  Resp: 18 18  Temp: 98.7 F (37.1 C)   SpO2: 98% 99%    Constitutional: Alert and oriented. Eyes: Conjunctivae are normal. Head: Atraumatic. Nose: No congestion/rhinnorhea. Mouth/Throat: Mucous membranes are moist.  Cardiovascular: Normal rate, regular rhythm. Grossly normal heart sounds.  2+ radial pulses bilaterally. Respiratory: Normal respiratory effort.  No retractions. Lungs CTAB. Gastrointestinal: Soft and nontender. No distention. Musculoskeletal: No lower extremity tenderness nor edema.  Splint in place to left lower extremity. Neurologic:  Normal speech and language. No gross focal neurologic deficits are  appreciated.    ED Results / Procedures / Treatments   Labs (all labs ordered are listed, but only abnormal results are displayed) Labs Reviewed  BASIC METABOLIC PANEL - Abnormal; Notable for the following components:      Result Value   Glucose, Bld 118 (*)    All other components within normal limits  D-DIMER, QUANTITATIVE - Abnormal; Notable for the following components:   D-Dimer, Quant 1.66 (*)    All other components within normal limits  CBC  TROPONIN I (HIGH SENSITIVITY)  TROPONIN I (HIGH SENSITIVITY)     EKG  ED ECG REPORT I, Blake Divine, the attending physician, personally viewed and interpreted this ECG.   Date: 11/25/2021  EKG Time: 22:08  Rate: 97  Rhythm: normal sinus rhythm  Axis: Normal  Intervals:none  ST&T Change: Nonspecific T wave changes  RADIOLOGY CTA of chest reviewed and interpreted by me with segmental pulmonary embolus noted on left.  PROCEDURES:  Critical Care performed: Yes, see critical care procedure note(s)  .Critical Care Performed by: Blake Divine, MD Authorized by: Blake Divine, MD   Critical care provider statement:    Critical care time (minutes):  30  Critical care time was exclusive of:  Separately billable procedures and treating other patients and teaching time   Critical care was necessary to treat or prevent imminent or life-threatening deterioration of the following conditions:  Circulatory failure   Critical care was time spent personally by me on the following activities:  Development of treatment plan with patient or surrogate, discussions with consultants, evaluation of patient's response to treatment, examination of patient, ordering and review of laboratory studies, ordering and review of radiographic studies, ordering and performing treatments and interventions, pulse oximetry, re-evaluation of patient's condition and review of old charts   I assumed direction of critical care for this patient from another  provider in my specialty: no     Care discussed with: admitting provider     MEDICATIONS ORDERED IN ED: Medications  iohexol (OMNIPAQUE) 350 MG/ML injection 75 mL (75 mLs Intravenous Contrast Given 11/25/21 0042)     IMPRESSION / MDM / [place] / ED COURSE  I reviewed the triage vital signs and the nursing notes.                              56 y.o. male with past medical history of meningioma and Achilles tendon rupture who presents to the ED complaining of 24 hours of sharp pain in the left side of his chest worse when he goes to take a deep breath or talk.  Patient's presentation is most consistent with acute presentation with potential threat to life or bodily function.  Differential diagnosis includes, but is not limited to, ACS, PE, pneumonia, pneumothorax, musculoskeletal pain, GERD.  Patient well-appearing and in no acute distress, vital signs are unremarkable, EKG shows nonspecific T wave changes.  Troponin is negative and I doubt ACS given atypical symptoms.  D-dimer noted to be elevated and CTA positive for segmental and subsegmental pulmonary embolus, no evidence of right heart strain.  We will start patient on heparin drip and case discussed with hospitalist for admission.  Remainder of labs are reassuring with CBC showing no anemia or leukocytosis, BMP without electrolyte abnormality or AKI.      FINAL CLINICAL IMPRESSION(S) / ED DIAGNOSES   Final diagnoses:  Acute pulmonary embolism without acute cor pulmonale, unspecified pulmonary embolism type ([place])     Rx / DC Orders   ED Discharge Orders     None        Note:  This document was prepared using Dragon voice recognition software and may include unintentional dictation errors.   Blake Divine, MD 11/25/21 [phone]

## 2021-11-25 NOTE — Assessment & Plan Note (Addendum)
Currently in splint.  Continue scheduled follow-up.  Patient having no pain.

## 2021-11-25 NOTE — H&P (Signed)
History and Physical    Patient: Jonathan Brewer FTD:[id number] DOB: 1965/09/08 DOA: 11/25/2021 DOS: the patient was seen and examined on 11/25/2021 PCP: Idelle Crouch, MD  Patient coming from: Home  Chief Complaint:  Chief Complaint  Patient presents with   Chest Pain    HPI: Jonathan Brewer is a 56 y.o. male with medical history significant for Meningioma, and full-thickness left Achilles tendon rupture around mid May s/p surgical repair by podiatry on 11/20/21 and currently in a splint who presents to the ED with a 1 day history of intermittent left-sided chest pain described as sharp and worse when talking or taking a deep breath.  He denies cough, fever or chills.  Has been taking a baby aspirin since his surgery. ED course and data review: On arrival tachycardic to 101 with otherwise normal vitals.  Labs showed D-dimer of 1.66 and normal troponin.  BMP and CBC unremarkable.  CT angio chest showed segmental and subsegmental emboli within branches of the left upper lobe pulmonary artery with overall small clot burden. Patient started on a heparin drip and hospitalist consulted for admission.   Review of Systems: As mentioned in the history of present illness. All other systems reviewed and are negative.  Past Medical History:  Diagnosis Date   Abnormal glucose    Achilles rupture, left    B12 deficiency    History of herpes simplex infection    Meningioma (HCC)    Vitamin D deficiency    Past Surgical History:  Procedure Laterality Date   ACHILLES TENDON SURGERY Left 11/20/2021   Procedure: ACHILLES TENDON REPAIR;  Surgeon: [name], DPM;  Location: [facility] ORS;  Service: Podiatry;  Laterality: Left;   COLONOSCOPY     COLONOSCOPY W/ POLYPECTOMY     Social History:  reports that he has never smoked. He has never used smokeless tobacco. He reports that he does not drink alcohol and does not use drugs.  No Known Allergies  No family history on file.  Prior to Admission  medications   Medication Sig Start Date End Date Taking? Authorizing Provider  ascorbic acid (VITAMIN C) 1000 MG tablet Take 1,000 mg by mouth daily.    [provider]  Cholecalciferol (VITAMIN D-3 PO) Take 1 tablet by mouth daily.    [provider]  cyclobenzaprine (FLEXERIL) 5 MG tablet Take 1 tablet (5 mg total) by mouth 3 (three) times daily as needed. Patient not taking: Reported on 11/19/2021 05/20/21   Menshew, Dannielle Karvonen, PA-C  oxyCODONE-acetaminophen (PERCOCET) 5-325 MG tablet Take 1-2 tablets by mouth every 6 (six) hours as needed for severe pain. Max 6 tabs per day 11/20/21   [name], DPM  valACYclovir (VALTREX) 1000 MG tablet Take 1,000 mg by mouth as needed.    [provider]  vitamin B-12 (CYANOCOBALAMIN) 1000 MCG tablet Take 1,000 mcg by mouth daily.    [provider]    Physical Exam: Vitals:   11/24/21 2203 11/25/21 0025 11/25/21 0155  BP: 123/90 125/77 (!) 132/92  Pulse: (!) 101 78 80  Resp: '20 18 18  '$ Temp: 98.8 F (37.1 C) 98.7 F (37.1 C)   TempSrc: Oral Oral   SpO2: 98% 98% 99%  Weight: 83.9 kg    Height: '6\' 1"'$  (1.854 m)     Physical Exam Vitals and nursing note reviewed.  Constitutional:      General: He is not in acute distress. HENT:     Head: Normocephalic and atraumatic.  Cardiovascular:  Rate and Rhythm: Normal rate and regular rhythm.     Heart sounds: Normal heart sounds.  Pulmonary:     Effort: Pulmonary effort is normal.     Breath sounds: Normal breath sounds.  Abdominal:     Palpations: Abdomen is soft.     Tenderness: There is no abdominal tenderness.  Musculoskeletal:     Comments: Left lower leg in splint  Neurological:     Mental Status: Mental status is at baseline.    Labs on Admission: I have personally reviewed following labs and imaging studies  CBC: Recent Labs  Lab 11/24/21 2206  WBC 6.8  HGB 15.1  HCT 45.3  MCV 88.0  PLT 425   Basic Metabolic Panel: Recent Labs   Lab 11/24/21 2206  NA 138  K 4.0  CL 102  CO2 28  GLUCOSE 118*  BUN 14  CREATININE 0.97  CALCIUM 9.2   GFR: Estimated Creatinine Clearance: 95 mL/min (by C-G formula based on SCr of 0.97 mg/dL). Liver Function Tests: No results for input(s): AST, ALT, ALKPHOS, BILITOT, PROT, ALBUMIN in the last 168 hours. No results for input(s): LIPASE, AMYLASE in the last 168 hours. No results for input(s): AMMONIA in the last 168 hours. Coagulation Profile: No results for input(s): INR, PROTIME in the last 168 hours. Cardiac Enzymes: No results for input(s): CKTOTAL, CKMB, CKMBINDEX, TROPONINI in the last 168 hours. BNP (last 3 results) No results for input(s): PROBNP in the last 8760 hours. HbA1C: No results for input(s): HGBA1C in the last 72 hours. CBG: No results for input(s): GLUCAP in the last 168 hours. Lipid Profile: No results for input(s): CHOL, HDL, LDLCALC, TRIG, CHOLHDL, LDLDIRECT in the last 72 hours. Thyroid Function Tests: No results for input(s): TSH, T4TOTAL, FREET4, T3FREE, THYROIDAB in the last 72 hours. Anemia Panel: No results for input(s): VITAMINB12, FOLATE, FERRITIN, TIBC, IRON, RETICCTPCT in the last 72 hours. Urine analysis: No results found for: COLORURINE, APPEARANCEUR, LABSPEC, [place], GLUCOSEU, HGBUR, BILIRUBINUR, KETONESUR, PROTEINUR, UROBILINOGEN, NITRITE, LEUKOCYTESUR  Radiological Exams on Admission: DG Chest 1 View  Result Date: 11/24/2021 CLINICAL DATA:  [id number] EXAM: CHEST  1 VIEW COMPARISON:  Chest x-ray 11/13/2013 FINDINGS: The heart and mediastinal contours are within normal limits. No focal consolidation. No pulmonary edema. No pleural effusion. No pneumothorax. No acute osseous abnormality. IMPRESSION: No active disease. Electronically Signed   By: Iven Finn M.D.   On: 11/24/2021 22:39   CT Angio Chest PE W/Cm &/Or Wo Cm  Result Date: 11/25/2021 CLINICAL DATA:  Left chest pain x1 day, elevated D-dimer, evaluate for PE EXAM: CT  ANGIOGRAPHY CHEST WITH CONTRAST TECHNIQUE: Multidetector CT imaging of the chest was performed using the standard protocol during bolus administration of intravenous contrast. Multiplanar CT image reconstructions and MIPs were obtained to evaluate the vascular anatomy. RADIATION DOSE REDUCTION: This exam was performed according to the departmental dose-optimization program which includes automated exposure control, adjustment of the mA and/or kV according to patient size and/or use of iterative reconstruction technique. CONTRAST:  66m OMNIPAQUE IOHEXOL 350 MG/ML SOLN COMPARISON:  Chest radiograph dated 11/24/2021 FINDINGS: Cardiovascular: Satisfactory opacification of the bilateral pulmonary arteries to the segmental level. Segmental and subsegmental emboli within branches of the left upper lobe pulmonary artery (series 4/images 77 and 79). Overall clot burden is small. Study is not tailored for evaluation of the thoracic aorta. No evidence of thoracic aortic aneurysm. The heart is normal in size.  No pericardial effusion. Mediastinum/Nodes: No suspicious mediastinal lymphadenopathy. Visualized thyroid is unremarkable.  Lungs/Pleura: Biapical pleural-parenchymal scarring. No suspicious pulmonary nodules. No focal consolidation. Minimal left basilar opacity, likely atelectasis, with trace pleural fluid. No pneumothorax. Upper Abdomen: Visualized upper abdomen is grossly unremarkable. Musculoskeletal: Visualized osseous structures are within normal limits. Review of the MIP images confirms the above findings. IMPRESSION: Segmental and subsegmental emboli within branches of the left upper lobe pulmonary artery. Overall clot burden is small. Critical Value/emergent results were called by telephone at the time of interpretation on 11/25/2021 at 12:57 am to provider [facility] , who verbally acknowledged these results. Electronically Signed   By: Julian Hy M.D.   On: 11/25/2021 01:01     Data  Reviewed: Relevant notes from primary care and specialist visits, past discharge summaries as available in EHR, including Care Everywhere. Prior diagnostic testing as pertinent to current admission diagnoses Updated medications and problem lists for reconciliation ED course, including vitals, labs, imaging, treatment and response to treatment Triage notes, nursing and pharmacy notes and ED provider's notes Notable results as noted in HPI   Assessment and Plan: * Acute pulmonary embolism ([place]) Suspect provoked PE related to recent immobilization with boot related to Achilles tendon rupture Continue heparin infusion and transition to oral anticoagulant Pain control Supplemental O2 if needed Follow-up lower extremity Doppler to evaluate for DVT to confirm suspicion for provoked PE  S/P Achilles tendon repair 11/20/21 Currently in splint Pain control Continue scheduled follow-up  Meningioma ([place]) No acute issues        DVT prophylaxis: On heparin infusion  Consults: none  Advance Care Planning:   Code Status: Prior full code  Family Communication: none  Disposition Plan: Back to previous home environment  Severity of Illness: The appropriate patient status for this patient is OBSERVATION. Observation status is judged to be reasonable and necessary in order to provide the required intensity of service to ensure the patient's safety. The patient's presenting symptoms, physical exam findings, and initial radiographic and laboratory data in the context of their medical condition is felt to place them at decreased risk for further clinical deterioration. Furthermore, it is anticipated that the patient will be medically stable for discharge from the hospital within 2 midnights of admission.   Author: Athena Masse, MD 11/25/2021 2:33 AM  For on call review [email or web address].

## 2021-11-25 NOTE — Discharge Summary (Signed)
Physician Discharge Summary   Patient: Jonathan Brewer MRN: [id number] DOB: Oct 27, 1965  Admit date:     11/25/2021  Discharge date: {dischdate:26783}  Discharge Physician: Annita Brod   PCP: Idelle Crouch, MD   Recommendations at discharge:  {Tip this will not be part of the note when signed- Example include specific recommendations for outpatient follow-up, pending tests to follow-up on. (Optional):26781}  ***  Discharge Diagnoses: Principal Problem:   Acute pulmonary embolism (HCC) Active Problems:   S/P Achilles tendon repair 11/20/21   Meningioma ([place])   Pulmonary emboli (HCC)  Resolved Problems:   * No resolved hospital problems. *  Hospital Course: 56 year old male with past medical history of meningioma and left Achilles tendon rupture in mid May status post surgical repair by podiatry on 5/26 who presented to the emergency room on the night of 5/30 with complaints of left-sided chest pain.  Work-up revealed multiple small pulmonary emboli.  Assessment and Plan: * Acute pulmonary embolism ([place]) Suspect provoked PE related to recent immobilization with boot related to Achilles tendon rupture Continue heparin infusion and transition to oral anticoagulant Pain control Supplemental O2 if needed Follow-up lower extremity Doppler to evaluate for DVT to confirm suspicion for provoked PE  S/P Achilles tendon repair 11/20/21 Currently in splint Pain control Continue scheduled follow-up  Meningioma ([place]) No acute issues      {Tip this will not be part of the note when signed Body mass index is 24.41 kg/m. , ,  (Optional):26781}  {(NOTE) Pain control PDMP Statment (Optional):26782} Consultants: *** Procedures performed: ***  Disposition: {Plan; Disposition:26390} Diet recommendation:  Discharge Diet Orders (From admission, onward)     Start     Ordered   11/25/21 0000  Diet - low sodium heart healthy        11/25/21 1751            {Diet_Plan:26776} DISCHARGE MEDICATION: Allergies as of 11/25/2021   No Known Allergies      Medication List     STOP taking these medications    aspirin EC 81 MG tablet   oxyCODONE-acetaminophen 5-325 MG tablet Commonly known as: Percocet       TAKE these medications    Apixaban Starter Pack ('10mg'$  and '5mg'$ ) Commonly known as: ELIQUIS STARTER PACK Take as directed on package: start with two-'5mg'$  tablets twice daily for 7 days. On day 8, switch to one-'5mg'$  tablet twice daily.   ascorbic acid 1000 MG tablet Commonly known as: VITAMIN C Take 1,000 mg by mouth daily.   cyclobenzaprine 5 MG tablet Commonly known as: FLEXERIL Take 1 tablet (5 mg total) by mouth 3 (three) times daily as needed.   traMADol 50 MG tablet Commonly known as: ULTRAM Take 1 tablet (50 mg total) by mouth every 6 (six) hours as needed for moderate pain.   valACYclovir 1000 MG tablet Commonly known as: VALTREX Take 1,000 mg by mouth as needed.   vitamin B-12 1000 MCG tablet Commonly known as: CYANOCOBALAMIN Take 1,000 mcg by mouth daily.   VITAMIN D-3 PO Take 1 tablet by mouth daily.        Discharge Exam: Filed Weights   11/24/21 2203  Weight: 83.9 kg   ***  Condition at discharge: {DC Condition:26389}  The results of significant diagnostics from this hospitalization (including imaging, microbiology, ancillary and laboratory) are listed below for reference.   Imaging Studies: DG Chest 1 View  Result Date: 11/24/2021 CLINICAL DATA:  [id number] EXAM: CHEST  1 VIEW COMPARISON:  Chest x-ray 11/13/2013 FINDINGS: The heart and mediastinal contours are within normal limits. No focal consolidation. No pulmonary edema. No pleural effusion. No pneumothorax. No acute osseous abnormality. IMPRESSION: No active disease. Electronically Signed   By: Iven Finn M.D.   On: 11/24/2021 22:39   CT Angio Chest PE W/Cm &/Or Wo Cm  Result Date: 11/25/2021 CLINICAL DATA:  Left chest pain x1 day,  elevated D-dimer, evaluate for PE EXAM: CT ANGIOGRAPHY CHEST WITH CONTRAST TECHNIQUE: Multidetector CT imaging of the chest was performed using the standard protocol during bolus administration of intravenous contrast. Multiplanar CT image reconstructions and MIPs were obtained to evaluate the vascular anatomy. RADIATION DOSE REDUCTION: This exam was performed according to the departmental dose-optimization program which includes automated exposure control, adjustment of the mA and/or kV according to patient size and/or use of iterative reconstruction technique. CONTRAST:  1m OMNIPAQUE IOHEXOL 350 MG/ML SOLN COMPARISON:  Chest radiograph dated 11/24/2021 FINDINGS: Cardiovascular: Satisfactory opacification of the bilateral pulmonary arteries to the segmental level. Segmental and subsegmental emboli within branches of the left upper lobe pulmonary artery (series 4/images 77 and 79). Overall clot burden is small. Study is not tailored for evaluation of the thoracic aorta. No evidence of thoracic aortic aneurysm. The heart is normal in size.  No pericardial effusion. Mediastinum/Nodes: No suspicious mediastinal lymphadenopathy. Visualized thyroid is unremarkable. Lungs/Pleura: Biapical pleural-parenchymal scarring. No suspicious pulmonary nodules. No focal consolidation. Minimal left basilar opacity, likely atelectasis, with trace pleural fluid. No pneumothorax. Upper Abdomen: Visualized upper abdomen is grossly unremarkable. Musculoskeletal: Visualized osseous structures are within normal limits. Review of the MIP images confirms the above findings. IMPRESSION: Segmental and subsegmental emboli within branches of the left upper lobe pulmonary artery. Overall clot burden is small. Critical Value/emergent results were called by telephone at the time of interpretation on 11/25/2021 at 12:57 [facility] verbally [facility] these results. Electronically Signed   By: SJulian HyM.D.   On:  11/25/2021 01:01   UK[place]Venous Img Lower Bilateral (DVT)  Result Date: 11/25/2021 CLINICAL DATA:  Pulmonary embolism on CTA chest today. EXAM: BILATERAL LOWER EXTREMITY VENOUS DOPPLER ULTRASOUND TECHNIQUE: Gray-scale sonography with graded compression, as well as color Doppler and duplex ultrasound were performed to evaluate the lower extremity deep venous systems from the level of the common femoral vein and including the common femoral, femoral, profunda femoral, popliteal and calf veins including the posterior tibial, peroneal and gastrocnemius veins when visible. The superficial great saphenous vein was also interrogated. Spectral Doppler was utilized to evaluate flow at rest and with distal augmentation maneuvers in the common femoral, femoral and popliteal veins. COMPARISON:  None Available. FINDINGS: RIGHT LOWER EXTREMITY Common Femoral Vein: No evidence of thrombus. Normal compressibility, respiratory phasicity and response to augmentation. Saphenofemoral Junction: No evidence of thrombus. Normal compressibility and flow on color Doppler imaging. Profunda Femoral Vein: No evidence of thrombus. Normal compressibility and flow on color Doppler imaging. Femoral Vein: No evidence of thrombus. Normal compressibility, respiratory phasicity and response to augmentation. Popliteal Vein: No evidence of thrombus. Normal compressibility, respiratory phasicity and response to augmentation. Calf Veins: No evidence of thrombus. Normal compressibility and flow on color Doppler imaging. Superficial Great Saphenous Vein: No evidence of thrombus. Normal compressibility. Venous Reflux:  None. Other Findings:  None. LEFT LOWER EXTREMITY Common Femoral Vein: No evidence of thrombus. Normal compressibility, respiratory phasicity and response to augmentation. Saphenofemoral Junction: No evidence of thrombus. Normal compressibility and flow on color Doppler imaging. Profunda Femoral Vein: No  evidence of thrombus. Normal  compressibility and flow on color Doppler imaging. Femoral Vein: No evidence of thrombus. Normal compressibility, respiratory phasicity and response to augmentation. Popliteal Vein: No evidence of thrombus. Normal compressibility, respiratory phasicity and response to augmentation. Calf Veins: Unable to evaluate due to overlying bandages. Superficial Great Saphenous Vein: No evidence of thrombus. Normal compressibility. Venous Reflux:  None. Other Findings: There is an elliptical-shaped hypoechoic lesion in the left popliteal fossa with no visible color flow measuring 1.9 x 0.8 x 0.9 cm. IMPRESSION: 1. No evidence of deep venous thrombosis in either lower extremity, but unable to evaluate the left calf veins due to overlying bandages. 2. 1.9 x 0.8 x 0.9 cm hypoechoic lesion in the left popliteal fossa which could be a hemorrhagic Baker's cyst or a popliteal lymph node most likely. There is no appreciable color flow. Electronically Signed   By: Telford Nab M.D.   On: 11/25/2021 05:32    Microbiology: No results found for this or any previous visit.  Labs: CBC: Recent Labs  Lab 11/24/21 2206  WBC 6.8  HGB 15.1  HCT 45.3  MCV 88.0  PLT 329   Basic Metabolic Panel: Recent Labs  Lab 11/24/21 2206  NA 138  K 4.0  CL 102  CO2 28  GLUCOSE 118*  BUN 14  CREATININE 0.97  CALCIUM 9.2   Liver Function Tests: No results for input(s): AST, ALT, ALKPHOS, BILITOT, PROT, ALBUMIN in the last 168 hours. CBG: No results for input(s): GLUCAP in the last 168 hours.  Discharge time spent: {LESS THAN/GREATER JMEQ:68341} 30 minutes.  Signed: Annita Brod, MD [facility] 11/25/2021

## 2021-11-25 NOTE — Progress Notes (Signed)
ANTICOAGULATION CONSULT NOTE   Pharmacy Consult for heparin infusion Indication: pulmonary embolus  No Known Allergies  Patient Measurements: Height: '6\' 1"'$  (185.4 cm) Weight: 83.9 kg (185 lb) IBW/kg (Calculated) : 79.9 Heparin Dosing Weight: 83.9 kg  Vital Signs: Temp: 98.7 F (37.1 C) (05/31 0025) Temp Source: Oral (05/31 0025) BP: 132/92 (05/31 0155) Pulse Rate: 80 (05/31 0155)  Labs: Recent Labs    11/24/21 2206 11/25/21 0023  HGB 15.1  --   HCT 45.3  --   PLT 208  --   CREATININE 0.97  --   TROPONINIHS 6 5    Estimated Creatinine Clearance: 95 mL/min (by C-G formula based on SCr of 0.97 mg/dL).   Medical History: Past Medical History:  Diagnosis Date   Abnormal glucose    Achilles rupture, left    B12 deficiency    History of herpes simplex infection    Meningioma (HCC)    Vitamin D deficiency     Assessment: Pt is 56 yo male presenting to ED c/o CP found w/ "Segmental and subsegmental emboli within branches of the left upper lobe pulmonary artery."  Goal of Therapy:  Heparin level 0.3-0.7 units/ml Monitor platelets by anticoagulation protocol: Yes   Plan:  Bolus 5000 units x 1 Start heparin infusion at 1400 units/hr Check HL in 6 hr after start of infusion CBC daily while on heparin.  Renda Rolls, PharmD, [facility] 11/25/2021 2:35 AM

## 2021-11-25 NOTE — ED Notes (Signed)
Pt became diaphoretic and bradycardic. Provider at bedside. Pt vomited and then reported improvement. HR came back up to 70s. Pt resting in NAD at this time. Will continue to monitor.

## 2021-11-25 NOTE — Assessment & Plan Note (Addendum)
Suspect provoked PE related to recent immobilization with boot related to Achilles tendon rupture.  Switched over to Eliquis on morning of 5/31 which she tolerated well.  He will need to continue on Eliquis for 6 months.  During this time, he will stop his aspirin and avoid other NSAID products.

## 2021-11-25 NOTE — ED Notes (Signed)
Pt still clammy and hypotensive. Provider messaged and IV fluids running.

## 2021-11-25 NOTE — ED Provider Notes (Signed)
Asked to review ECG which has been ordered by admitting physician Interpreted at 1230 Heart rate 65 QRS 90 QTc 400 Normal sinus rhythm, T wave inversions noted in lateral precordial leads No STEMI   Delman Kitten, MD 11/25/21 1230

## 2021-11-25 NOTE — ED Notes (Signed)
US at bedside

## 2021-11-25 NOTE — TOC Benefit Eligibility Note (Signed)
Patient Teacher, English as a foreign language completed.    The patient is currently admitted and upon discharge could be taking Eliquis 5 mg.  The current 30 day co-pay is, $525.58 due to a $7,500.00 deductible.   The patient is currently admitted and upon discharge could be taking Xarelto 20 mg.  The current 30 day co-pay is, $508.45 due to a $7,500.00 deductible.   The patient is insured through [employer], [place] Patient [employer] Patient Advocate Team Direct Number: [phone]  Fax: [phone]

## 2021-11-25 NOTE — Assessment & Plan Note (Signed)
No acute issues.

## 2021-11-25 NOTE — Progress Notes (Signed)
Admission profile updated. ?

## 2021-11-26 DIAGNOSIS — I2699 Other pulmonary embolism without acute cor pulmonale: Secondary | ICD-10-CM | POA: Diagnosis not present

## 2021-11-26 DIAGNOSIS — R972 Elevated prostate specific antigen [PSA]: Secondary | ICD-10-CM | POA: Diagnosis not present

## 2021-11-26 DIAGNOSIS — I951 Orthostatic hypotension: Secondary | ICD-10-CM | POA: Diagnosis not present

## 2021-11-26 NOTE — Assessment & Plan Note (Signed)
On morning of 5/31, patient still having some episodes of chest pain.  Oxy IR x1 given around 1030.  Patient examined around 1130 and look to be doing well.  Suddenly he began to complain of some nausea and started having episode of bradycardia with heart rate down into the 40s.  He was symptomatic and briefly passed out.  He then vomited.  Attending physician at bedside and able to have patient turn to the side so that he did not aspirate.  Patient then woke up and heart rate normalized.  In discussion with patient, he stated that although he had been prescribed Percocet for his Achilles' heel injury, pain is quite tolerable and he had not yet taken any.  Felt patient was quite sensitive to pain medications which caused him to become bradycardic as well as orthostatic hypotension causing syncopal/near syncopal event.  Patient given some IV fluids which improved his blood pressure and by late afternoon of 5/31, patient feeling completely fine and able to ambulate with stable blood pressure and heart rate and he was discharged home.  We agreed that he should not take any further narcotic medications and he was prescribed Ultram instead for his pulmonary embolus pain as needed.  Patient did receive 1 Ultram following this event which controlled his pain without any noticeable effects.

## 2021-12-08 ENCOUNTER — Other Ambulatory Visit: Payer: Self-pay | Admitting: Neurosurgery

## 2021-12-08 DIAGNOSIS — D329 Benign neoplasm of meninges, unspecified: Secondary | ICD-10-CM

## 2021-12-09 DIAGNOSIS — I2699 Other pulmonary embolism without acute cor pulmonale: Secondary | ICD-10-CM | POA: Diagnosis not present

## 2021-12-22 ENCOUNTER — Ambulatory Visit
Admission: RE | Admit: 2021-12-22 | Discharge: 2021-12-22 | Disposition: A | Discharge status: Home / Self Care | Payer: 59 | Source: Ambulatory Visit | Attending: Neurosurgery | Admitting: Neurosurgery

## 2021-12-22 DIAGNOSIS — D329 Benign neoplasm of meninges, unspecified: Secondary | ICD-10-CM | POA: Diagnosis not present

## 2021-12-22 DIAGNOSIS — R220 Localized swelling, mass and lump, head: Secondary | ICD-10-CM | POA: Diagnosis not present

## 2021-12-22 DIAGNOSIS — R519 Headache, unspecified: Secondary | ICD-10-CM | POA: Diagnosis not present

## 2021-12-22 MED ORDER — GADOBENATE DIMEGLUMINE 529 MG/ML IV SOLN
17.0000 mL | Freq: Once | INTRAVENOUS | Status: AC | PRN
Start: 2021-12-22 — End: 2021-12-22
  Administered 2021-12-22: 17 mL via INTRAVENOUS

## 2021-12-23 DIAGNOSIS — Z9889 Other specified postprocedural states: Secondary | ICD-10-CM | POA: Diagnosis not present

## 2022-01-01 ENCOUNTER — Other Ambulatory Visit: Payer: Self-pay | Admitting: Neurosurgery

## 2022-01-01 DIAGNOSIS — D329 Benign neoplasm of meninges, unspecified: Secondary | ICD-10-CM

## 2022-01-01 NOTE — Progress Notes (Signed)
Repeat mri in 6 months

## 2022-01-06 DIAGNOSIS — D329 Benign neoplasm of meninges, unspecified: Secondary | ICD-10-CM | POA: Diagnosis not present

## 2022-01-06 DIAGNOSIS — R7309 Other abnormal glucose: Secondary | ICD-10-CM | POA: Diagnosis not present

## 2022-01-06 DIAGNOSIS — I2699 Other pulmonary embolism without acute cor pulmonale: Secondary | ICD-10-CM | POA: Diagnosis not present

## 2022-01-06 DIAGNOSIS — Z13220 Encounter for screening for lipoid disorders: Secondary | ICD-10-CM | POA: Diagnosis not present

## 2022-01-06 DIAGNOSIS — E559 Vitamin D deficiency, unspecified: Secondary | ICD-10-CM | POA: Diagnosis not present

## 2022-01-06 DIAGNOSIS — Z125 Encounter for screening for malignant neoplasm of prostate: Secondary | ICD-10-CM | POA: Diagnosis not present

## 2022-01-06 DIAGNOSIS — E538 Deficiency of other specified B group vitamins: Secondary | ICD-10-CM | POA: Diagnosis not present

## 2022-01-06 DIAGNOSIS — Z79899 Other long term (current) drug therapy: Secondary | ICD-10-CM | POA: Diagnosis not present

## 2022-01-12 DIAGNOSIS — Z9889 Other specified postprocedural states: Secondary | ICD-10-CM | POA: Diagnosis not present

## 2022-01-20 DIAGNOSIS — Z9889 Other specified postprocedural states: Secondary | ICD-10-CM | POA: Diagnosis not present

## 2022-02-03 DIAGNOSIS — Z9889 Other specified postprocedural states: Secondary | ICD-10-CM | POA: Diagnosis not present

## 2022-03-12 DIAGNOSIS — E559 Vitamin D deficiency, unspecified: Secondary | ICD-10-CM | POA: Diagnosis not present

## 2022-03-16 DIAGNOSIS — S86012D Strain of left Achilles tendon, subsequent encounter: Secondary | ICD-10-CM | POA: Diagnosis not present

## 2022-03-19 DIAGNOSIS — Z79899 Other long term (current) drug therapy: Secondary | ICD-10-CM | POA: Diagnosis not present

## 2022-03-19 DIAGNOSIS — E559 Vitamin D deficiency, unspecified: Secondary | ICD-10-CM | POA: Diagnosis not present

## 2022-03-19 DIAGNOSIS — R739 Hyperglycemia, unspecified: Secondary | ICD-10-CM | POA: Diagnosis not present

## 2022-03-19 DIAGNOSIS — D329 Benign neoplasm of meninges, unspecified: Secondary | ICD-10-CM | POA: Diagnosis not present

## 2022-03-19 DIAGNOSIS — E538 Deficiency of other specified B group vitamins: Secondary | ICD-10-CM | POA: Diagnosis not present

## 2022-03-19 DIAGNOSIS — Z0000 Encounter for general adult medical examination without abnormal findings: Secondary | ICD-10-CM | POA: Diagnosis not present

## 2022-03-19 DIAGNOSIS — I2699 Other pulmonary embolism without acute cor pulmonale: Secondary | ICD-10-CM | POA: Diagnosis not present

## 2022-03-19 DIAGNOSIS — Z9889 Other specified postprocedural states: Secondary | ICD-10-CM | POA: Diagnosis not present

## 2022-03-25 ENCOUNTER — Other Ambulatory Visit: Payer: Self-pay | Admitting: Internal Medicine

## 2022-03-25 DIAGNOSIS — D329 Benign neoplasm of meninges, unspecified: Secondary | ICD-10-CM

## 2022-05-25 ENCOUNTER — Ambulatory Visit
Admission: RE | Admit: 2022-05-25 | Discharge: 2022-05-25 | Disposition: A | Discharge status: Home / Self Care | Payer: 59 | Source: Ambulatory Visit | Attending: Internal Medicine | Admitting: Internal Medicine

## 2022-05-25 DIAGNOSIS — D329 Benign neoplasm of meninges, unspecified: Secondary | ICD-10-CM | POA: Diagnosis not present

## 2022-05-25 DIAGNOSIS — G939 Disorder of brain, unspecified: Secondary | ICD-10-CM | POA: Diagnosis not present

## 2022-05-25 DIAGNOSIS — I6782 Cerebral ischemia: Secondary | ICD-10-CM | POA: Diagnosis not present

## 2022-05-25 DIAGNOSIS — R220 Localized swelling, mass and lump, head: Secondary | ICD-10-CM | POA: Diagnosis not present

## 2022-05-25 MED ORDER — GADOBENATE DIMEGLUMINE 529 MG/ML IV SOLN
15.0000 mL | Freq: Once | INTRAVENOUS | Status: AC | PRN
Start: 2022-05-25 — End: 2022-05-25
  Administered 2022-05-25: 15 mL via INTRAVENOUS

## 2022-05-27 ENCOUNTER — Telehealth: Payer: Self-pay

## 2022-05-27 NOTE — Telephone Encounter (Signed)
You wanted a repeat scan on him in December however Dr. Doy Hutching ordered one and he had this completed on 05/25/22. Do you want to set up a telephone call to discuss his results?

## 2022-05-27 NOTE — Telephone Encounter (Signed)
-----   Message from Rae Lips, [place] sent at 05/05/2022  3:00 PM EST ----- Patient was scheduled for Brain MRI on 05/25/22. Dr. Izora Ribas wanted this repeated in 6 months(supposed to be completed in December) and it was ordered by Dr. Doy Hutching.

## 2022-05-28 ENCOUNTER — Other Ambulatory Visit (INDEPENDENT_AMBULATORY_CARE_PROVIDER_SITE_OTHER): Payer: Self-pay | Admitting: Neurosurgery

## 2022-05-28 ENCOUNTER — Telehealth (INDEPENDENT_AMBULATORY_CARE_PROVIDER_SITE_OTHER): Payer: Self-pay | Admitting: Neurosurgery

## 2022-05-28 DIAGNOSIS — D329 Benign neoplasm of meninges, unspecified: Secondary | ICD-10-CM

## 2022-05-28 NOTE — Telephone Encounter (Signed)
I spoke with Mr. Durio to review his MRI results.  His MRI is stable.  We will repeat his MRI scan in approximately 2 years.

## 2022-08-11 DIAGNOSIS — Z79899 Other long term (current) drug therapy: Secondary | ICD-10-CM | POA: Diagnosis not present

## 2022-08-11 DIAGNOSIS — I2601 Septic pulmonary embolism with acute cor pulmonale: Secondary | ICD-10-CM | POA: Diagnosis not present

## 2022-08-11 DIAGNOSIS — D329 Benign neoplasm of meninges, unspecified: Secondary | ICD-10-CM | POA: Diagnosis not present

## 2022-08-11 DIAGNOSIS — R739 Hyperglycemia, unspecified: Secondary | ICD-10-CM | POA: Diagnosis not present

## 2022-08-11 DIAGNOSIS — E538 Deficiency of other specified B group vitamins: Secondary | ICD-10-CM | POA: Diagnosis not present

## 2022-08-11 DIAGNOSIS — E559 Vitamin D deficiency, unspecified: Secondary | ICD-10-CM | POA: Diagnosis not present

## 2022-09-19 DIAGNOSIS — Z03818 Encounter for observation for suspected exposure to other biological agents ruled out: Secondary | ICD-10-CM | POA: Diagnosis not present

## 2022-09-19 DIAGNOSIS — R051 Acute cough: Secondary | ICD-10-CM | POA: Diagnosis not present

## 2022-09-19 DIAGNOSIS — Z6824 Body mass index (BMI) 24.0-24.9, adult: Secondary | ICD-10-CM | POA: Diagnosis not present

## 2023-03-21 DIAGNOSIS — Z125 Encounter for screening for malignant neoplasm of prostate: Secondary | ICD-10-CM | POA: Diagnosis not present

## 2023-03-21 DIAGNOSIS — E559 Vitamin D deficiency, unspecified: Secondary | ICD-10-CM | POA: Diagnosis not present

## 2023-03-21 DIAGNOSIS — Z0000 Encounter for general adult medical examination without abnormal findings: Secondary | ICD-10-CM | POA: Diagnosis not present

## 2023-03-21 DIAGNOSIS — G459 Transient cerebral ischemic attack, unspecified: Secondary | ICD-10-CM | POA: Diagnosis not present

## 2023-03-21 DIAGNOSIS — Z79899 Other long term (current) drug therapy: Secondary | ICD-10-CM | POA: Diagnosis not present

## 2023-03-21 DIAGNOSIS — R739 Hyperglycemia, unspecified: Secondary | ICD-10-CM | POA: Diagnosis not present

## 2023-03-21 DIAGNOSIS — D329 Benign neoplasm of meninges, unspecified: Secondary | ICD-10-CM | POA: Diagnosis not present

## 2023-03-21 DIAGNOSIS — Z8619 Personal history of other infectious and parasitic diseases: Secondary | ICD-10-CM | POA: Diagnosis not present

## 2023-03-21 DIAGNOSIS — E538 Deficiency of other specified B group vitamins: Secondary | ICD-10-CM | POA: Diagnosis not present

## 2023-04-07 DIAGNOSIS — Z7982 Long term (current) use of aspirin: Secondary | ICD-10-CM | POA: Diagnosis not present

## 2023-04-07 DIAGNOSIS — R739 Hyperglycemia, unspecified: Secondary | ICD-10-CM | POA: Diagnosis not present

## 2023-04-07 DIAGNOSIS — Z86711 Personal history of pulmonary embolism: Secondary | ICD-10-CM | POA: Diagnosis not present

## 2023-04-07 DIAGNOSIS — Z833 Family history of diabetes mellitus: Secondary | ICD-10-CM | POA: Diagnosis not present

## 2023-04-07 DIAGNOSIS — D329 Benign neoplasm of meninges, unspecified: Secondary | ICD-10-CM | POA: Diagnosis not present

## 2023-04-07 DIAGNOSIS — N400 Enlarged prostate without lower urinary tract symptoms: Secondary | ICD-10-CM | POA: Diagnosis not present

## 2023-04-08 DIAGNOSIS — G459 Transient cerebral ischemic attack, unspecified: Secondary | ICD-10-CM | POA: Diagnosis not present

## 2023-04-08 DIAGNOSIS — Z8673 Personal history of transient ischemic attack (TIA), and cerebral infarction without residual deficits: Secondary | ICD-10-CM | POA: Diagnosis not present

## 2023-04-19 DIAGNOSIS — F419 Anxiety disorder, unspecified: Secondary | ICD-10-CM | POA: Diagnosis not present

## 2023-04-25 DIAGNOSIS — R972 Elevated prostate specific antigen [PSA]: Secondary | ICD-10-CM | POA: Diagnosis not present

## 2023-05-02 DIAGNOSIS — F419 Anxiety disorder, unspecified: Secondary | ICD-10-CM | POA: Diagnosis not present

## 2023-05-17 DIAGNOSIS — F419 Anxiety disorder, unspecified: Secondary | ICD-10-CM | POA: Diagnosis not present

## 2023-05-31 DIAGNOSIS — F419 Anxiety disorder, unspecified: Secondary | ICD-10-CM | POA: Diagnosis not present

## 2023-06-13 DIAGNOSIS — F419 Anxiety disorder, unspecified: Secondary | ICD-10-CM | POA: Diagnosis not present

## 2023-07-05 DIAGNOSIS — F419 Anxiety disorder, unspecified: Secondary | ICD-10-CM | POA: Diagnosis not present

## 2023-07-20 DIAGNOSIS — F419 Anxiety disorder, unspecified: Secondary | ICD-10-CM | POA: Diagnosis not present

## 2023-08-03 DIAGNOSIS — F419 Anxiety disorder, unspecified: Secondary | ICD-10-CM | POA: Diagnosis not present

## 2023-08-22 DIAGNOSIS — F419 Anxiety disorder, unspecified: Secondary | ICD-10-CM | POA: Diagnosis not present

## 2023-09-12 DIAGNOSIS — F419 Anxiety disorder, unspecified: Secondary | ICD-10-CM | POA: Diagnosis not present

## 2023-09-29 DIAGNOSIS — F419 Anxiety disorder, unspecified: Secondary | ICD-10-CM | POA: Diagnosis not present

## 2023-10-20 DIAGNOSIS — F419 Anxiety disorder, unspecified: Secondary | ICD-10-CM | POA: Diagnosis not present

## 2023-11-24 DIAGNOSIS — F419 Anxiety disorder, unspecified: Secondary | ICD-10-CM | POA: Diagnosis not present
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
## 2023-12-02 DIAGNOSIS — Z7982 Long term (current) use of aspirin: Secondary | ICD-10-CM | POA: Diagnosis not present

## 2023-12-02 DIAGNOSIS — Z833 Family history of diabetes mellitus: Secondary | ICD-10-CM | POA: Diagnosis not present

## 2023-12-19 DIAGNOSIS — F419 Anxiety disorder, unspecified: Secondary | ICD-10-CM | POA: Diagnosis not present

## 2024-01-09 DIAGNOSIS — F419 Anxiety disorder, unspecified: Secondary | ICD-10-CM | POA: Diagnosis not present

## 2024-01-30 DIAGNOSIS — F419 Anxiety disorder, unspecified: Secondary | ICD-10-CM | POA: Diagnosis not present

## 2024-02-21 DIAGNOSIS — F419 Anxiety disorder, unspecified: Secondary | ICD-10-CM | POA: Diagnosis not present

## 2024-03-13 DIAGNOSIS — F419 Anxiety disorder, unspecified: Secondary | ICD-10-CM | POA: Diagnosis not present

## 2024-03-23 ENCOUNTER — Other Ambulatory Visit: Payer: Self-pay | Admitting: Internal Medicine

## 2024-03-23 DIAGNOSIS — Z125 Encounter for screening for malignant neoplasm of prostate: Secondary | ICD-10-CM | POA: Diagnosis not present

## 2024-03-23 DIAGNOSIS — Z79899 Other long term (current) drug therapy: Secondary | ICD-10-CM | POA: Diagnosis not present

## 2024-03-23 DIAGNOSIS — D329 Benign neoplasm of meninges, unspecified: Secondary | ICD-10-CM

## 2024-03-23 DIAGNOSIS — E538 Deficiency of other specified B group vitamins: Secondary | ICD-10-CM | POA: Diagnosis not present

## 2024-03-23 DIAGNOSIS — R972 Elevated prostate specific antigen [PSA]: Secondary | ICD-10-CM | POA: Diagnosis not present

## 2024-03-23 DIAGNOSIS — Z1211 Encounter for screening for malignant neoplasm of colon: Secondary | ICD-10-CM | POA: Diagnosis not present

## 2024-03-23 DIAGNOSIS — Z0000 Encounter for general adult medical examination without abnormal findings: Secondary | ICD-10-CM | POA: Diagnosis not present

## 2024-03-23 DIAGNOSIS — R739 Hyperglycemia, unspecified: Secondary | ICD-10-CM | POA: Diagnosis not present

## 2024-03-23 DIAGNOSIS — Z13220 Encounter for screening for lipoid disorders: Secondary | ICD-10-CM | POA: Diagnosis not present

## 2024-03-23 DIAGNOSIS — E559 Vitamin D deficiency, unspecified: Secondary | ICD-10-CM | POA: Diagnosis not present

## 2024-03-23 DIAGNOSIS — Z1331 Encounter for screening for depression: Secondary | ICD-10-CM | POA: Diagnosis not present

## 2024-03-23 DIAGNOSIS — I951 Orthostatic hypotension: Secondary | ICD-10-CM | POA: Diagnosis not present

## 2024-03-26 ENCOUNTER — Encounter: Payer: Self-pay | Admitting: Internal Medicine

## 2024-04-03 ENCOUNTER — Ambulatory Visit
Admission: RE | Admit: 2024-04-03 | Discharge: 2024-04-03 | Disposition: A | Discharge status: Home / Self Care | Source: Ambulatory Visit | Attending: Internal Medicine | Admitting: Internal Medicine

## 2024-04-03 DIAGNOSIS — D329 Benign neoplasm of meninges, unspecified: Secondary | ICD-10-CM

## 2024-04-03 DIAGNOSIS — D320 Benign neoplasm of cerebral meninges: Secondary | ICD-10-CM | POA: Diagnosis not present

## 2024-04-03 MED ORDER — GADOPICLENOL 0.5 MMOL/ML IV SOLN
10.0000 mL | Freq: Once | INTRAVENOUS | Status: AC | PRN
  Administered 2024-04-03: 8 mL via INTRAVENOUS

## 2024-04-10 DIAGNOSIS — F419 Anxiety disorder, unspecified: Secondary | ICD-10-CM | POA: Diagnosis not present

## 2024-05-08 DIAGNOSIS — F419 Anxiety disorder, unspecified: Secondary | ICD-10-CM | POA: Diagnosis not present

## 2024-06-12 DIAGNOSIS — F419 Anxiety disorder, unspecified: Secondary | ICD-10-CM | POA: Diagnosis not present

## 2024-07-06 ENCOUNTER — Telehealth: Payer: Self-pay

## 2024-07-06 NOTE — Telephone Encounter (Signed)
 Patient is due for a repeat Brain MRI but he had one in October 2025.   Do you want him to still have this one?

## 2024-07-12 NOTE — Telephone Encounter (Signed)
 I spoke with the patient and notified him that his Meningioma has grown. He verbalized understanding and is agreeable to see Dr. Rosslyn.  Lauren, I'm sending this to you as a FYI.

## 2024-07-13 NOTE — Telephone Encounter (Signed)
 Attempted to contact patient and spouse. Both voicemails are full. Will try again

## 2024-07-19 NOTE — Telephone Encounter (Signed)
 07/19/24- attempted again to contact patient and spouse. Both voicemails are full

## 2024-07-31 NOTE — Telephone Encounter (Signed)
 07/31/24 voicemail full

## 2024-08-15 ENCOUNTER — Ambulatory Visit: Admit: 2024-08-15 | Admitting: Gastroenterology
